# Patient Record
Sex: Male | Born: 1995 | Race: White | Hispanic: No | Marital: Single
Health system: Southern US, Community
[De-identification: ages and names within clinical notes are randomized; demographics above are authoritative.]

## PROBLEM LIST (undated history)

## (undated) DIAGNOSIS — J45909 Unspecified asthma, uncomplicated: Secondary | ICD-10-CM

---

## 2018-09-22 ENCOUNTER — Other Ambulatory Visit: Payer: Self-pay | Admitting: *Deleted

## 2018-09-22 DIAGNOSIS — Z20822 Contact with and (suspected) exposure to covid-19: Secondary | ICD-10-CM

## 2018-09-28 LAB — NOVEL CORONAVIRUS, NAA: SARS-CoV-2, NAA: NOT DETECTED

## 2018-10-07 ENCOUNTER — Telehealth: Payer: Self-pay

## 2018-10-07 NOTE — Telephone Encounter (Signed)
Patient called to receive results of Covid-19 test.  Advised patient he is negative.

## 2018-12-25 ENCOUNTER — Other Ambulatory Visit: Payer: Self-pay

## 2018-12-25 DIAGNOSIS — Z20822 Contact with and (suspected) exposure to covid-19: Secondary | ICD-10-CM

## 2018-12-27 LAB — NOVEL CORONAVIRUS, NAA: SARS-CoV-2, NAA: NOT DETECTED

## 2019-01-22 ENCOUNTER — Other Ambulatory Visit: Payer: Self-pay

## 2019-01-22 DIAGNOSIS — Z20822 Contact with and (suspected) exposure to covid-19: Secondary | ICD-10-CM

## 2019-01-23 LAB — NOVEL CORONAVIRUS, NAA: SARS-CoV-2, NAA: NOT DETECTED

## 2019-04-12 ENCOUNTER — Ambulatory Visit
Admission: RE | Admit: 2019-04-12 | Discharge: 2019-04-12 | Disposition: A | Discharge status: Home / Self Care | Payer: No Typology Code available for payment source | Source: Ambulatory Visit | Attending: Nurse Practitioner | Admitting: Nurse Practitioner

## 2019-04-12 ENCOUNTER — Other Ambulatory Visit: Payer: Self-pay | Admitting: Nurse Practitioner

## 2019-04-12 DIAGNOSIS — Z021 Encounter for pre-employment examination: Secondary | ICD-10-CM

## 2019-06-01 ENCOUNTER — Emergency Department (HOSPITAL_COMMUNITY): Payer: BLUE CROSS/BLUE SHIELD

## 2019-06-01 ENCOUNTER — Other Ambulatory Visit: Payer: Self-pay

## 2019-06-01 ENCOUNTER — Emergency Department (HOSPITAL_COMMUNITY)
Admission: EM | Admit: 2019-06-01 | Discharge: 2019-06-02 | Disposition: A | Discharge status: Home / Self Care | Payer: BLUE CROSS/BLUE SHIELD | Attending: Emergency Medicine | Admitting: Emergency Medicine

## 2019-06-01 ENCOUNTER — Encounter (HOSPITAL_COMMUNITY): Payer: Self-pay | Admitting: Emergency Medicine

## 2019-06-01 DIAGNOSIS — R42 Dizziness and giddiness: Secondary | ICD-10-CM | POA: Diagnosis not present

## 2019-06-01 DIAGNOSIS — Z5321 Procedure and treatment not carried out due to patient leaving prior to being seen by health care provider: Secondary | ICD-10-CM | POA: Insufficient documentation

## 2019-06-01 HISTORY — DX: Unspecified asthma, uncomplicated: J45.909

## 2019-06-01 LAB — CBC
HCT: 42.7 % (ref 39.0–52.0)
Hemoglobin: 14 g/dL (ref 13.0–17.0)
MCH: 29.8 pg (ref 26.0–34.0)
MCHC: 32.8 g/dL (ref 30.0–36.0)
MCV: 90.9 fL (ref 80.0–100.0)
Platelets: 222 10*3/uL (ref 150–400)
RBC: 4.7 MIL/uL (ref 4.22–5.81)
RDW: 12.1 % (ref 11.5–15.5)
WBC: 7.1 10*3/uL (ref 4.0–10.5)
nRBC: 0 % (ref 0.0–0.2)

## 2019-06-01 LAB — BASIC METABOLIC PANEL
Anion gap: 10 (ref 5–15)
BUN: 11 mg/dL (ref 6–20)
CO2: 25 mmol/L (ref 22–32)
Calcium: 9.4 mg/dL (ref 8.9–10.3)
Chloride: 104 mmol/L (ref 98–111)
Creatinine, Ser: 0.94 mg/dL (ref 0.61–1.24)
GFR calc Af Amer: >60 mL/min (ref >60)
GFR calc non Af Amer: >60 mL/min (ref >60)
Glucose, Bld: 100 mg/dL — ABNORMAL HIGH (ref 70–99)
Potassium: 3.6 mmol/L (ref 3.5–5.1)
Sodium: 139 mmol/L (ref 135–145)

## 2019-06-01 LAB — TROPONIN I (HIGH SENSITIVITY): Troponin I (High Sensitivity): 3 ng/L (ref <18)

## 2019-06-01 MED ORDER — SODIUM CHLORIDE 0.9% FLUSH: 3.0000 mL | Freq: Once | INTRAVENOUS | Status: DC

## 2019-06-01 NOTE — ED Triage Notes (Signed)
Pt reports he was sitting in class and his heart began to race, he also became dizzy.  Stated he felt "like I was going to die".  Pt states the only way to feel better was to go outside.  States he is under the most stress he has ever been under.

## 2020-05-01 IMAGING — CR DG CHEST 1V
1 series · 1 of 1 positions shown · non-contrast
Comparison: None.

CLINICAL DATA: Pre-employment examination

EXAM:
CHEST  1 VIEW

[w chest pa]
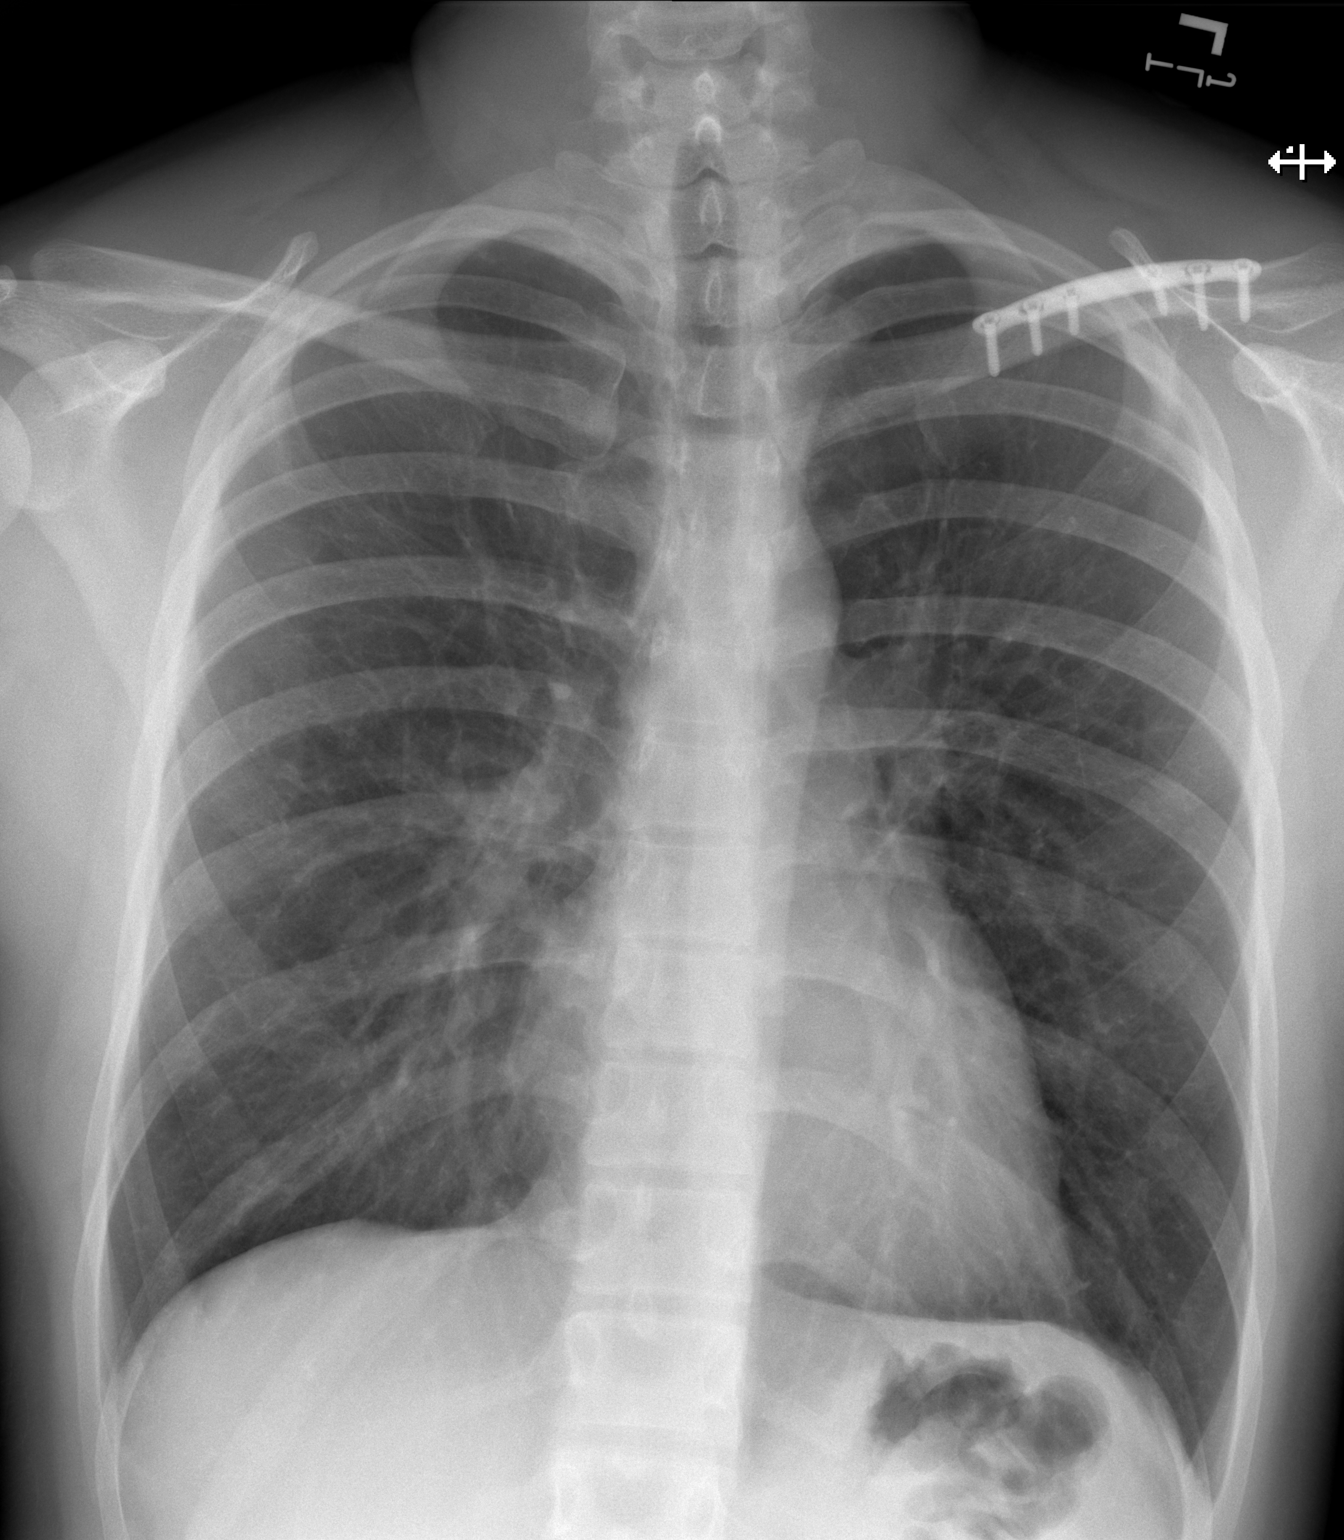

[1 of 1 positions shown; findings below may reference images not displayed]

FINDINGS: The heart size and mediastinal contours are within normal limits.
Both lungs are clear. Plate and screw fixation of the mid left
clavicle.
IMPRESSION: No acute abnormality of the lungs in frontal projection.

## 2020-06-20 IMAGING — CR DG CHEST 2V
2 series · 2 of 2 positions shown · non-contrast
Comparison: 04/12/2019

CLINICAL DATA: Chest pain

EXAM:
CHEST - 2 VIEW

[chest pa]
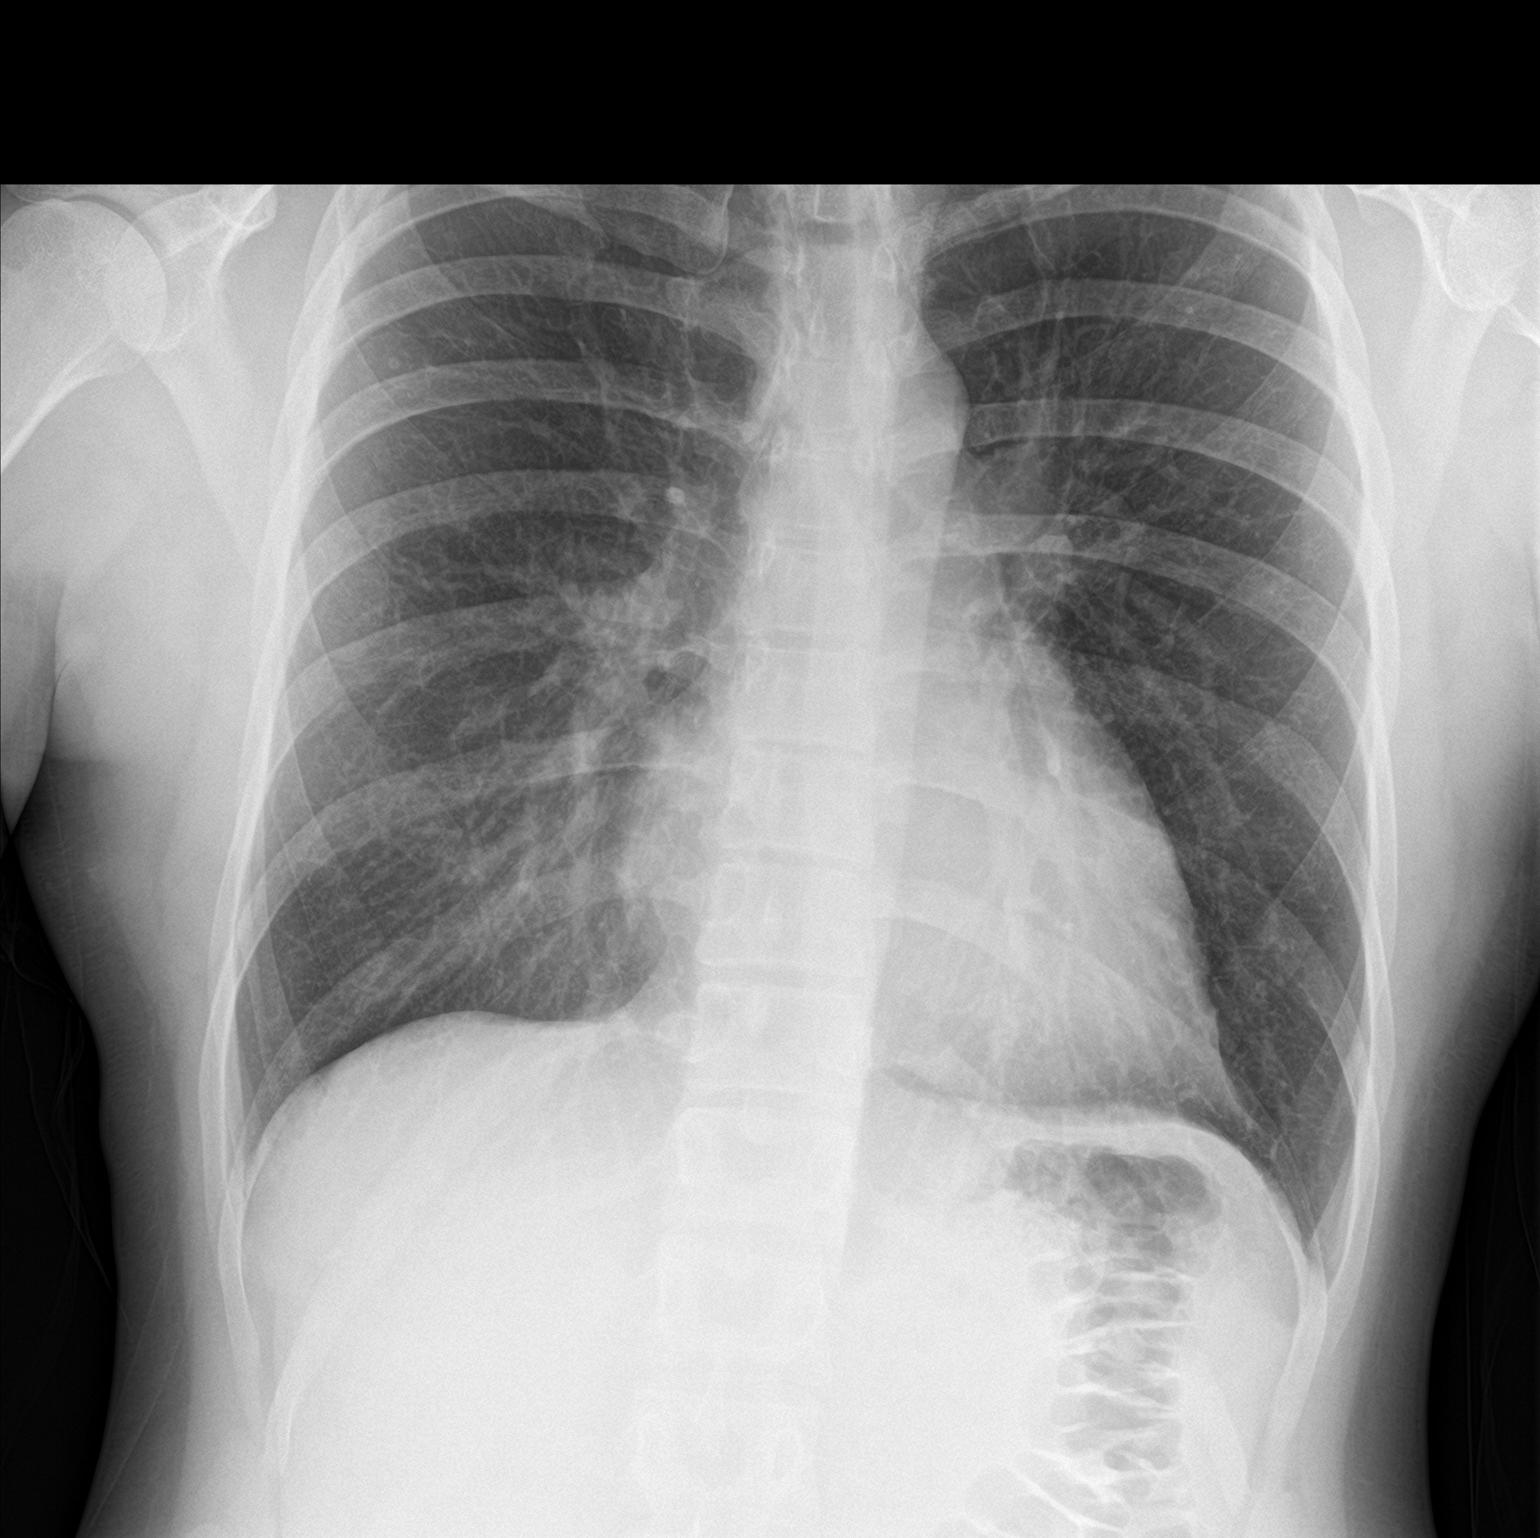

[chest lat]
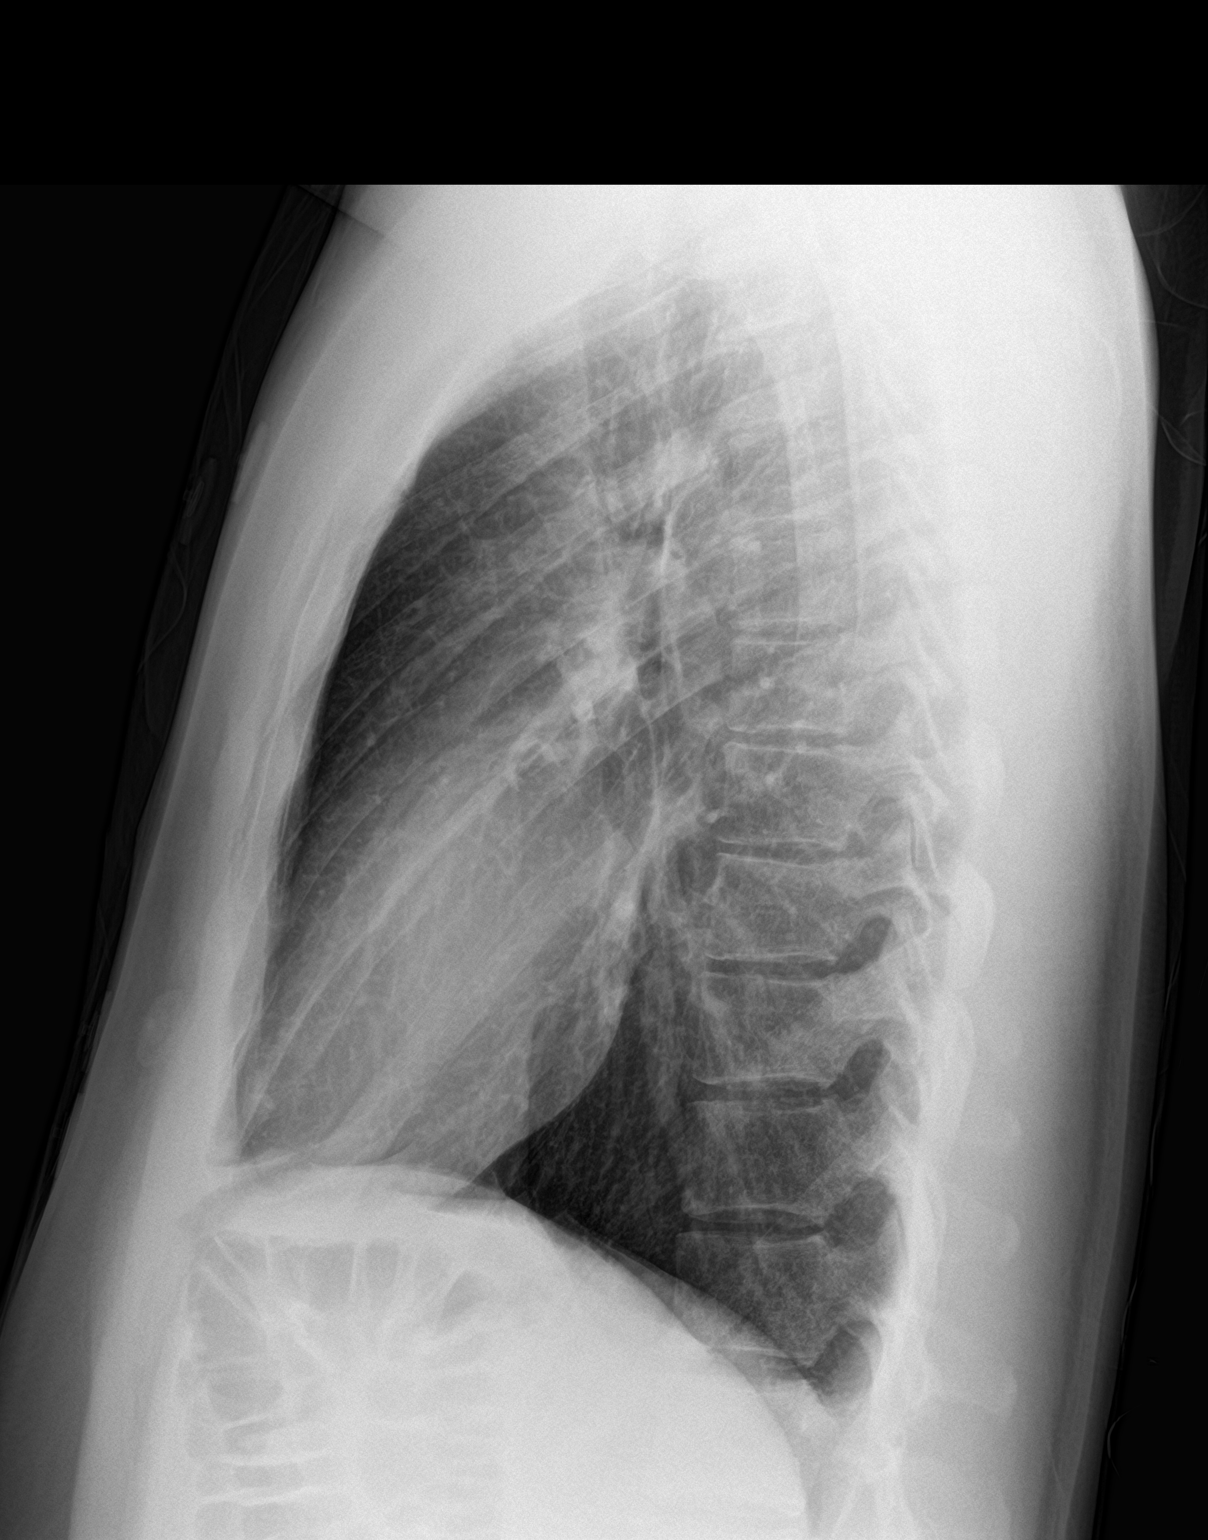

[2 of 2 positions shown; findings below may reference images not displayed]

FINDINGS: Surgical plate and fixating screws in the left clavicle. No focal
opacity or pleural effusion. Normal cardiomediastinal silhouette. No
pneumothorax.
IMPRESSION: No active cardiopulmonary disease.

## 2023-06-27 ENCOUNTER — Other Ambulatory Visit (HOSPITAL_BASED_OUTPATIENT_CLINIC_OR_DEPARTMENT_OTHER): Payer: Self-pay

## 2023-06-27 MED ORDER — XYOSTED 100 MG/0.5ML ~~LOC~~ SOAJ
100.0000 mg | SUBCUTANEOUS | 5 refills | Status: AC
  Filled 2023-06-27: qty 2, 28d supply, fill #0

## 2023-06-27 MED ORDER — DICYCLOMINE HCL 20 MG PO TABS
20.0000 mg | ORAL_TABLET | Freq: Four times a day (QID) | ORAL | 1 refills | Status: AC
  Filled 2023-06-27 – 2023-08-12 (×2): qty 120, 30d supply, fill #0

## 2023-07-04 ENCOUNTER — Other Ambulatory Visit (HOSPITAL_BASED_OUTPATIENT_CLINIC_OR_DEPARTMENT_OTHER): Payer: Self-pay

## 2023-07-04 MED ORDER — METRONIDAZOLE 500 MG PO TABS
500.0000 mg | ORAL_TABLET | Freq: Two times a day (BID) | ORAL | 0 refills | Status: AC
  Filled 2023-07-04 – 2023-07-05 (×2): qty 14, 7d supply, fill #0

## 2023-07-05 ENCOUNTER — Other Ambulatory Visit (HOSPITAL_BASED_OUTPATIENT_CLINIC_OR_DEPARTMENT_OTHER): Payer: Self-pay

## 2023-07-07 ENCOUNTER — Other Ambulatory Visit (HOSPITAL_BASED_OUTPATIENT_CLINIC_OR_DEPARTMENT_OTHER): Payer: Self-pay

## 2023-07-27 ENCOUNTER — Other Ambulatory Visit (HOSPITAL_BASED_OUTPATIENT_CLINIC_OR_DEPARTMENT_OTHER): Payer: Self-pay

## 2023-07-27 MED ORDER — XYOSTED 100 MG/0.5ML ~~LOC~~ SOAJ
100.0000 mg | SUBCUTANEOUS | 5 refills | Status: AC
  Filled 2023-07-27 – 2023-08-12 (×2): qty 2, 28d supply, fill #0

## 2023-07-28 ENCOUNTER — Other Ambulatory Visit (HOSPITAL_BASED_OUTPATIENT_CLINIC_OR_DEPARTMENT_OTHER): Payer: Self-pay

## 2023-08-12 ENCOUNTER — Other Ambulatory Visit (HOSPITAL_BASED_OUTPATIENT_CLINIC_OR_DEPARTMENT_OTHER): Payer: Self-pay

## 2023-08-13 ENCOUNTER — Other Ambulatory Visit (HOSPITAL_BASED_OUTPATIENT_CLINIC_OR_DEPARTMENT_OTHER): Payer: Self-pay

## 2023-08-13 ENCOUNTER — Other Ambulatory Visit: Payer: Self-pay

## 2023-08-14 ENCOUNTER — Other Ambulatory Visit (HOSPITAL_BASED_OUTPATIENT_CLINIC_OR_DEPARTMENT_OTHER): Payer: Self-pay

## 2023-09-20 ENCOUNTER — Other Ambulatory Visit (HOSPITAL_BASED_OUTPATIENT_CLINIC_OR_DEPARTMENT_OTHER): Payer: Self-pay

## 2023-09-20 MED ORDER — XYOSTED 100 MG/0.5ML ~~LOC~~ SOAJ
SUBCUTANEOUS | 5 refills | Status: AC
  Filled 2023-09-20: qty 2, 28d supply, fill #0

## 2023-09-22 ENCOUNTER — Other Ambulatory Visit (HOSPITAL_BASED_OUTPATIENT_CLINIC_OR_DEPARTMENT_OTHER): Payer: Self-pay

## 2023-09-23 ENCOUNTER — Other Ambulatory Visit (HOSPITAL_BASED_OUTPATIENT_CLINIC_OR_DEPARTMENT_OTHER): Payer: Self-pay

## 2023-09-24 ENCOUNTER — Other Ambulatory Visit (HOSPITAL_BASED_OUTPATIENT_CLINIC_OR_DEPARTMENT_OTHER): Payer: Self-pay

## 2023-10-24 ENCOUNTER — Other Ambulatory Visit (HOSPITAL_BASED_OUTPATIENT_CLINIC_OR_DEPARTMENT_OTHER): Payer: Self-pay

## 2023-10-24 MED ORDER — XYOSTED 100 MG/0.5ML ~~LOC~~ SOAJ
100.0000 mg | SUBCUTANEOUS | 5 refills | Status: AC
  Filled 2023-10-24: qty 2, 28d supply, fill #0

## 2023-10-27 ENCOUNTER — Other Ambulatory Visit: Payer: Self-pay

## 2023-10-27 ENCOUNTER — Other Ambulatory Visit (HOSPITAL_BASED_OUTPATIENT_CLINIC_OR_DEPARTMENT_OTHER): Payer: Self-pay

## 2023-10-29 ENCOUNTER — Other Ambulatory Visit (HOSPITAL_BASED_OUTPATIENT_CLINIC_OR_DEPARTMENT_OTHER): Payer: Self-pay

## 2023-10-31 ENCOUNTER — Other Ambulatory Visit (HOSPITAL_BASED_OUTPATIENT_CLINIC_OR_DEPARTMENT_OTHER): Payer: Self-pay

## 2023-12-09 ENCOUNTER — Other Ambulatory Visit (HOSPITAL_BASED_OUTPATIENT_CLINIC_OR_DEPARTMENT_OTHER): Payer: Self-pay

## 2023-12-09 MED ORDER — XYOSTED 100 MG/0.5ML ~~LOC~~ SOAJ
100.0000 mg | SUBCUTANEOUS | 5 refills | Status: AC
  Filled 2023-12-09: qty 2, 28d supply, fill #0

## 2023-12-10 ENCOUNTER — Other Ambulatory Visit (HOSPITAL_BASED_OUTPATIENT_CLINIC_OR_DEPARTMENT_OTHER): Payer: Self-pay

## 2024-01-29 ENCOUNTER — Other Ambulatory Visit (HOSPITAL_BASED_OUTPATIENT_CLINIC_OR_DEPARTMENT_OTHER): Payer: Self-pay

## 2024-01-29 ENCOUNTER — Encounter (HOSPITAL_BASED_OUTPATIENT_CLINIC_OR_DEPARTMENT_OTHER): Payer: Self-pay

## 2024-01-29 MED ORDER — XYOSTED 100 MG/0.5ML ~~LOC~~ SOAJ
100.0000 mg | SUBCUTANEOUS | 5 refills | Status: AC
  Filled 2024-01-29 – 2024-02-02 (×2): qty 2, 28d supply, fill #0

## 2024-02-02 ENCOUNTER — Other Ambulatory Visit: Payer: Self-pay

## 2024-02-02 ENCOUNTER — Other Ambulatory Visit (HOSPITAL_BASED_OUTPATIENT_CLINIC_OR_DEPARTMENT_OTHER): Payer: Self-pay

## 2024-02-03 ENCOUNTER — Other Ambulatory Visit (HOSPITAL_BASED_OUTPATIENT_CLINIC_OR_DEPARTMENT_OTHER): Payer: Self-pay

## 2024-02-09 ENCOUNTER — Other Ambulatory Visit (HOSPITAL_BASED_OUTPATIENT_CLINIC_OR_DEPARTMENT_OTHER): Payer: Self-pay

## 2024-02-09 MED ORDER — XYOSTED 100 MG/0.5ML ~~LOC~~ SOAJ: 100.0000 mg | SUBCUTANEOUS | 5 refills | Status: AC

## 2024-03-15 ENCOUNTER — Other Ambulatory Visit (HOSPITAL_BASED_OUTPATIENT_CLINIC_OR_DEPARTMENT_OTHER): Payer: Self-pay

## 2024-03-15 MED ORDER — XYOSTED 100 MG/0.5ML ~~LOC~~ SOAJ
100.0000 mg | SUBCUTANEOUS | 5 refills | Status: AC
  Filled 2024-03-15 – 2024-03-19 (×3): qty 2, 28d supply, fill #0

## 2024-03-17 ENCOUNTER — Other Ambulatory Visit (HOSPITAL_BASED_OUTPATIENT_CLINIC_OR_DEPARTMENT_OTHER): Payer: Self-pay

## 2024-03-19 ENCOUNTER — Other Ambulatory Visit (HOSPITAL_BASED_OUTPATIENT_CLINIC_OR_DEPARTMENT_OTHER): Payer: Self-pay

## 2024-03-19 ENCOUNTER — Encounter (HOSPITAL_BASED_OUTPATIENT_CLINIC_OR_DEPARTMENT_OTHER): Payer: Self-pay

## 2024-03-23 ENCOUNTER — Other Ambulatory Visit (HOSPITAL_BASED_OUTPATIENT_CLINIC_OR_DEPARTMENT_OTHER): Payer: Self-pay

## 2024-03-24 ENCOUNTER — Other Ambulatory Visit (HOSPITAL_BASED_OUTPATIENT_CLINIC_OR_DEPARTMENT_OTHER): Payer: Self-pay
